# Patient Record
Sex: Female | Born: 1970 | Race: White | Hispanic: No | Marital: Married | Smoking: Former smoker
Health system: Southern US, Community
[De-identification: ages and names within clinical notes are randomized; demographics above are authoritative.]

## PROBLEM LIST (undated history)

## (undated) HISTORY — PX: OTHER SURGICAL HISTORY: SHX169

## (undated) HISTORY — PX: HYSTEROSCOPY: SHX211

## (undated) HISTORY — PX: NOVASURE ABLATION: SHX5394

## (undated) HISTORY — PX: DILATION AND CURETTAGE, DIAGNOSTIC / THERAPEUTIC: SUR384

## (undated) HISTORY — PX: KNEE SURGERY: SHX244

---

## 2001-10-26 ENCOUNTER — Other Ambulatory Visit: Admission: RE | Admit: 2001-10-26 | Discharge: 2001-10-26 | Payer: Self-pay | Admitting: Obstetrics and Gynecology

## 2002-06-07 ENCOUNTER — Inpatient Hospital Stay (HOSPITAL_COMMUNITY): Admission: AD | Admit: 2002-06-07 | Discharge: 2002-06-11 | Payer: Self-pay | Admitting: Obstetrics and Gynecology

## 2002-06-08 ENCOUNTER — Encounter (INDEPENDENT_AMBULATORY_CARE_PROVIDER_SITE_OTHER): Payer: Self-pay | Admitting: *Deleted

## 2002-06-12 ENCOUNTER — Encounter: Admission: RE | Admit: 2002-06-12 | Discharge: 2002-07-12 | Payer: Self-pay | Admitting: Obstetrics and Gynecology

## 2002-07-13 ENCOUNTER — Encounter: Admission: RE | Admit: 2002-07-13 | Discharge: 2002-08-12 | Payer: Self-pay | Admitting: Obstetrics and Gynecology

## 2002-07-13 ENCOUNTER — Other Ambulatory Visit: Admission: RE | Admit: 2002-07-13 | Discharge: 2002-07-13 | Payer: Self-pay | Admitting: Obstetrics and Gynecology

## 2002-09-12 ENCOUNTER — Encounter: Admission: RE | Admit: 2002-09-12 | Discharge: 2002-10-12 | Payer: Self-pay | Admitting: Obstetrics and Gynecology

## 2002-10-13 ENCOUNTER — Encounter: Admission: RE | Admit: 2002-10-13 | Discharge: 2002-11-12 | Payer: Self-pay | Admitting: Obstetrics and Gynecology

## 2002-12-13 ENCOUNTER — Encounter: Admission: RE | Admit: 2002-12-13 | Discharge: 2003-01-12 | Payer: Self-pay | Admitting: Obstetrics and Gynecology

## 2004-01-20 ENCOUNTER — Encounter: Payer: Self-pay | Admitting: Cardiology

## 2005-05-10 ENCOUNTER — Inpatient Hospital Stay (HOSPITAL_COMMUNITY): Admission: AD | Admit: 2005-05-10 | Discharge: 2005-05-10 | Payer: Self-pay | Admitting: Obstetrics and Gynecology

## 2005-06-03 ENCOUNTER — Encounter (INDEPENDENT_AMBULATORY_CARE_PROVIDER_SITE_OTHER): Payer: Self-pay | Admitting: Specialist

## 2005-06-03 ENCOUNTER — Inpatient Hospital Stay (HOSPITAL_COMMUNITY): Admission: AD | Admit: 2005-06-03 | Discharge: 2005-06-05 | Payer: Self-pay | Admitting: Obstetrics and Gynecology

## 2008-09-17 ENCOUNTER — Encounter: Admission: RE | Admit: 2008-09-17 | Discharge: 2008-09-17 | Payer: Self-pay | Admitting: Obstetrics and Gynecology

## 2009-05-15 ENCOUNTER — Encounter: Payer: Self-pay | Admitting: Cardiology

## 2009-05-16 ENCOUNTER — Encounter: Admission: RE | Admit: 2009-05-16 | Discharge: 2009-05-16 | Payer: Self-pay | Admitting: Family Medicine

## 2009-07-30 ENCOUNTER — Encounter: Payer: Self-pay | Admitting: Cardiology

## 2009-07-31 ENCOUNTER — Encounter: Payer: Self-pay | Admitting: Cardiology

## 2009-08-01 ENCOUNTER — Encounter: Payer: Self-pay | Admitting: Cardiology

## 2009-08-04 ENCOUNTER — Encounter: Payer: Self-pay | Admitting: Cardiology

## 2009-08-06 ENCOUNTER — Encounter: Payer: Self-pay | Admitting: Cardiology

## 2009-10-15 ENCOUNTER — Encounter: Admission: RE | Admit: 2009-10-15 | Discharge: 2009-10-15 | Payer: Self-pay | Admitting: Obstetrics and Gynecology

## 2009-11-06 ENCOUNTER — Ambulatory Visit (HOSPITAL_COMMUNITY): Admission: RE | Admit: 2009-11-06 | Discharge: 2009-11-06 | Payer: Self-pay | Admitting: Obstetrics and Gynecology

## 2010-04-16 LAB — CBC
HCT: 37.5 % (ref 36.0–46.0)
Hemoglobin: 12.5 g/dL (ref 12.0–15.0)
MCHC: 33.5 g/dL (ref 30.0–36.0)
MCV: 91.8 fL (ref 78.0–100.0)
RBC: 4.08 MIL/uL (ref 3.87–5.11)

## 2010-05-16 ENCOUNTER — Encounter: Payer: Self-pay | Admitting: Cardiology

## 2010-05-19 ENCOUNTER — Encounter: Payer: Self-pay | Admitting: Cardiology

## 2010-05-19 ENCOUNTER — Ambulatory Visit (INDEPENDENT_AMBULATORY_CARE_PROVIDER_SITE_OTHER): Payer: BC Managed Care – PPO | Admitting: Cardiology

## 2010-05-19 VITALS — BP 130/68 | HR 63 | Resp 12 | Ht 66.0 in | Wt 144.0 lb

## 2010-05-19 DIAGNOSIS — R002 Palpitations: Secondary | ICD-10-CM

## 2010-05-19 NOTE — Patient Instructions (Signed)
Your physician recommends that you schedule a follow-up appointment as needed with Dr. Daleen Squibb  Your physician has requested that you have an echocardiogram. Echocardiography is a painless test that uses sound waves to create images of your heart. It provides your doctor with information about the size and shape of your heart and how well your heart's chambers and valves are working. This procedure takes approximately one hour. There are no restrictions for this procedure.

## 2010-05-19 NOTE — Progress Notes (Signed)
   Patient ID: Tonya Cordova, female    DOB: 08-03-1970, 40 y.o.   MRN: [id number]  HPI  Tonya Cordova is referred by Dr Billy Coast for the evaluation of the feeling of fast heartbeat and palpitations with exercise. She has been extremely active in the past working out on the treadmill and lifting weights 3-4 times a week. She had a knee surgery in March 2011 and just hasn't gotten back in shape since.   She denies Chest pain, DOE, sudden onset of a tachycardia. There is no hx suggestive of hyperthyroidism. No significant bleeding.  EKG TODAY IS NORMAL.   Review of Systems  All other systems reviewed and are negative.      Physical Exam  Nursing note and vitals reviewed. Constitutional: She is oriented to person, place, and time. She appears well-developed and well-nourished. No distress.  HENT:  Head: Normocephalic and atraumatic.  Eyes: EOM are normal. Pupils are equal, round, and reactive to light.  Neck: Normal range of motion. No JVD present. No tracheal deviation present. No thyromegaly present.  Cardiovascular: Normal rate, regular rhythm, normal heart sounds and intact distal pulses.  Exam reveals no gallop.        Split S1 vs click at apex.  Pulmonary/Chest: Effort normal and breath sounds normal.  Abdominal: Soft. Bowel sounds are normal. She exhibits no distension and no mass. There is no tenderness.  Musculoskeletal: Normal range of motion. She exhibits no edema.  Neurological: She is alert and oriented to person, place, and time.  Skin: Skin is warm and dry.  Psychiatric: She has a normal mood and affect.

## 2010-05-28 ENCOUNTER — Ambulatory Visit (HOSPITAL_COMMUNITY): Payer: BC Managed Care – PPO | Attending: Cardiology | Admitting: Radiology

## 2010-05-28 DIAGNOSIS — E119 Type 2 diabetes mellitus without complications: Secondary | ICD-10-CM | POA: Insufficient documentation

## 2010-05-28 DIAGNOSIS — E785 Hyperlipidemia, unspecified: Secondary | ICD-10-CM | POA: Insufficient documentation

## 2010-05-28 DIAGNOSIS — I10 Essential (primary) hypertension: Secondary | ICD-10-CM | POA: Insufficient documentation

## 2010-05-28 DIAGNOSIS — I2510 Atherosclerotic heart disease of native coronary artery without angina pectoris: Secondary | ICD-10-CM | POA: Insufficient documentation

## 2010-05-28 DIAGNOSIS — R002 Palpitations: Secondary | ICD-10-CM

## 2010-05-30 ENCOUNTER — Telehealth: Payer: Self-pay | Admitting: Cardiology

## 2010-05-30 NOTE — Telephone Encounter (Signed)
Pt aware of ECHO results. Debbie Gloria Lambertson RN  

## 2010-05-30 NOTE — Telephone Encounter (Signed)
Pt would like results of 2 d echo .

## 2010-06-20 NOTE — Op Note (Signed)
NAME:  Tonya Cordova, Tonya Cordova                           ACCOUNT NO.:  [id number]   MEDICAL RECORD NO.:  [id number]                   PATIENT TYPE:  INP   LOCATION:  9110                                 FACILITY:  [facility]   PHYSICIAN:  Lenoard Aden, M.D.             DATE OF BIRTH:  [birth year]   DATE OF PROCEDURE:  06/08/2002  DATE OF DISCHARGE:                                 OPERATIVE REPORT   PREOPERATIVE DIAGNOSES:  1. A 42 week intrauterine pregnancy.  2. Single umbilical artery.  3. Nonreassuring fetal heart rate.   POSTOPERATIVE DIAGNOSES:  1. A 42 week intrauterine pregnancy.  2. Single umbilical artery.  3. Nonreassuring fetal heart rate.   PROCEDURE:  Primary left TCS.   SURGEON:  Lenoard Aden, M.D.   ANESTHESIA:  Epidural by Quillian Quince, M.D.   ESTIMATED BLOOD LOSS:  1500 mL.   COUNTS:  Correct.   DRAINS:  Foley.   COMPLICATIONS:  None.   FINDINGS:  Full-term living female, 8 pounds 13 ounces, Apgars 8 and 9.  Placenta delivered manually, intact.  Two-vessel cord noted.  Pediatrics in  attendance.  Bulb suctioning performed.  Placenta to pathology.   BRIEF OPERATIVE NOTE:  After being apprised of the risks of anesthesia,  infection, bleeding, injury to abdominal organs, need for repair, patient is  brought to the operating room where administered an epidural anesthetic  without complications, prepped and draped in the usual sterile fashion, a  Foley catheter placed.  After achieving adequate anesthesia, dilute Marcaine  solution placed in the area and Pfannenstiel skin incision then made with  the scalpel, nicked in the midline, carried down to the fascia, and opened  transversely using Mayo scissors.  Rectus muscle is dissected sharply in the  midline, peritoneum entered sharply, the bladder blade placed.  Visceroperitoneum is scored in a smile-like fashion, Kerr hysterotomy  incision, atraumatically delivery, full-term, living female, Apgars 8 and 9,  pediatrics in attendance, placenta delivered manually intact from posterior  position.  Three-vessel cord noted.  Uterus is exteriorized, and normal  tubes and ovaries are noted.  Uterus is closed after being curetted with dry  lap pack and is closed in two layers using a running Monocryl suture, second  embrocating layer is placed.  Good hemostasis is achieved.  Bladder flap is  inspected.  Good hemostasis is accomplished.  Uterus is replaced in the  abdominal cavity.  Paracolic gutters irrigated, all blood clots subsequently  removed.  Peritoneum is inspected.  Bladder flap is inspected.  Uterine  incision is reinspected.  All found to have good hemostasis.  Please  note that postpartum uterine atony was controlled using a dilute Pitocin  solution and IM Methergine.  At this time, fascia is then closed using a 0  Monocryl in continuous running fashion.  Skin closed using staples.  The  patient tolerates the procedure well and goes to  recovery in good condition.                                               Lenoard Aden, M.D.    RJT/MEDQ  D:  06/08/2002  T:  06/10/2002  Job:  [id number]

## 2010-06-20 NOTE — Op Note (Signed)
[name], Tonya Cordova                 ACCOUNT NO.:  [id number]   MEDICAL RECORD NO.:  [id number]          PATIENT TYPE:  INP   LOCATION:  9124                          [facility]ILITY:  [facility]   PHYSICIAN:  Lenoard Aden, M.D.DATE OF BIRTH:  1970-11-08   DATE OF PROCEDURE:  06/03/2005  DATE OF DISCHARGE:                                 OPERATIVE REPORT   PREOPERATIVE DIAGNOSES:  1.  A 39-week intrauterine pregnancy.  2.  Previous section for repeat.  3.  Tubal ligation.   POSTOPERATIVE DIAGNOSES:  1.  A 39-week intrauterine pregnancy.  2.  Previous section for repeat.  3.  Tubal ligation.   PROCEDURES:  1.  Repeat low segment transverse cesarean section.  2.  Tubal ligation.   SURGEON:  Lenoard Aden, M.D.   ASSISTANT:  Marlinda Mike, C.N.M.   ANESTHESIA:  Spinal by Octaviano Glow. Pamalee Leyden, M.D.   ESTIMATED BLOOD LOSS:  1000 mL.   COMPLICATIONS:  None.   DRAINS:  Foley catheter.   INSTRUMENT COUNT:  Correct.   Patient to recovery room in good condition.   FINDINGS:  A full-term living female, Apgars 9 and 26.  Pediatricians in  attendance.  Tubal segments to pathology. Normal tubes and normal ovaries  noted.   DESCRIPTION OF PROCEDURE:  After being apprised of the risks of anesthesia,  infection, bleeding, injury to abdominal organs and need for repair, delayed  versus immediate complications to include bowel and bladder injury, patient  brought to the operating room where she was administered spinal anesthetic  without complications.  Prepped and draped in usual sterile fashion.  Foley  catheter placed.  After achieving adequate anesthesia, dilute Marcaine  solution placed in the area of previous Pfannenstiel skin incision made with  the scalpel, carried down to the fascia which was nicked in the midline and  opened transversely using Mayo scissors.  Rectus muscles dissected sharply  in the midline.  Peritoneum entered sharply.  Bladder blade placed.  Visceral peritoneum  scored in a smile-like fashion and entered sharply and  dissected sharply off the lower uterine segment.  Curved hysterotomy made  and extended using bandage scissors.  Atraumatic delivery of a full term  living female handed to the pediatricians in attendance.  Apgars of 8 and 9.  Cord blood collected.  Placenta delivered manually intact.  Three vessel  cord noted.  Uterus exteriorized and closed in one layer after being  curetted using a dry lap pad with 0 Monocryl suture.  Normal tubes, normal  ovaries noted.  At this time, the left tube was traced out to the fimbriated  end and ampullary isthmic portion of the tube is identified and avascular  portion of the mesosalpinx was cauterized, creating a window proximal and  distal plane ties are placed.  Tubal segment is excised.  The same procedure  which was done on the left tube is repeated on the right tube.  Tubal  segment is excised and both tubal lumens are visualized and cauterized on  both sides.  Incision is reinspected and found to be hemostatic.  Irrigation  is accomplished.  Bladder flap inspected and found to be hemostatic.  Fascia  then closed using 0 Monocryl in continuous running fashion.  Skin closed  using staples.  The patient tolerated the procedure well and was transferred  to recovery room in good condition.      Lenoard Aden, M.D.  Electronically Signed     RJT/MEDQ  D:  06/03/2005  T:  06/04/2005  Job:  [id number]

## 2010-06-20 NOTE — Discharge Summary (Signed)
   NAME:  Tonya Cordova, Tonya Cordova                           ACCOUNT NO.:  [id number]   MEDICAL RECORD NO.:  [id number]                   PATIENT TYPE:  INP   LOCATION:  9110                                 [facility]ILITY[facility]   PHYSICIAN:  Lenoard Aden, M.D.             DATE OF BIRTH:  [birth year]   DATE OF ADMISSION:  06/07/2002  DATE OF DISCHARGE:  06/11/2002                                 DISCHARGE SUMMARY   SUMMARY:  The patient underwent an uncomplicated primary C section for  nonreassuring fetal heart rate on Jun 08, 2002.  Postoperative course was  uncomplicated.  Tolerated a regular diet well.  Discharged to home on  postoperative day #3.  Prenatal vitamins, iron tablets given.  Follow up in  the office in four to six weeks.                                               Lenoard Aden, M.D.    RJT/MEDQ  D:  07/19/2002  T:  07/19/2002  Job:  [id number]

## 2010-06-20 NOTE — H&P (Signed)
   NAME:  Tonya Cordova, Tonya Cordova                           ACCOUNT NO.:  [id number]   MEDICAL RECORD NO.:  [id number]                   PATIENT TYPE:  INP   LOCATION:  9163                                 [facility]   PHYSICIAN:  Lenoard Aden, M.D.             DATE OF BIRTH:  [birth year]   DATE OF ADMISSION:  06/07/2002  DATE OF DISCHARGE:                                HISTORY & PHYSICAL   CHIEF COMPLAINT:  Post dates.   HISTORY OF PRESENT ILLNESS:  The patient is a 40 year old, white female, G1,  P0 with [facility] May 27, 2002, at 41-5/7 weeks who presents for cervical  ripening and induction.   ALLERGIES:  No known drug allergies.   MEDICATIONS:  Prenatal vitamins.   PAST OBSTETRICAL HISTORY:  Noncontributory.   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  No other medical or surgical hospitalizations.   FAMILY HISTORY:  Congenital heart disease.  Thyroid dysfunction.  Emphysema.  Heart disease.  Hypertension.   PRENATAL LABORATORY DATA:  Blood type O positive, Rh antibody negative,  rubella immune.  Hepatitis B surface antigen negative.  HIV nonreactive.  Pregnancy complicated by umbilical artery.   PHYSICAL EXAMINATION:  GENERAL:  She is a well-developed, well-nourished,  white female in no apparent distress.  HEENT:  Normal.  LUNGS:  Clear.  HEART:  Regular rate and rhythm.  ABDOMEN:  Soft, gravid, nontender.  Estimated fetal weight 9-1/2 pounds.  PELVIC:  Cervix is flared, 3 cm, vertex and -2.  EXTREMITIES:  No cords.  NEUROLOGIC:  Nonfocal.   IMPRESSION:  Post dates intrauterine pregnancy for cervical ripening and  induction.   PLAN:  Cervidil and Pitocin.  Anticipate delivery.                                                Lenoard Aden, M.D.    RJT/MEDQ  D:  06/07/2002  T:  06/08/2002  Job:  [id number]

## 2010-06-20 NOTE — Discharge Summary (Signed)
Tonya Cordova, Tonya Cordova                 ACCOUNT NO.:  [id number]   MEDICAL RECORD NO.:  [id number]          PATIENT TYPE:  INP   LOCATION:  9124                          [facility]   PHYSICIAN:  Lenoard Aden, M.D.DATE OF BIRTH:  [birth year]   DATE OF ADMISSION:  06/03/2005  DATE OF DISCHARGE:  06/05/2005                                 DISCHARGE SUMMARY   Patient with an uncomplicated primary repeat cesarean section Jun 03, 2005.  Postoperative course uncomplicated.  Tolerated, gave regular diet.  Postoperative hemoglobin 10.1.  Discharged to home on day #2.  Prenatal  vitamins, Percocet and iron given.  Follow-up in the office in four to six  weeks. Discharge teaching done.      Lenoard Aden, M.D.  Electronically Signed     RJT/MEDQ  D:  07/05/2005  T:  07/06/2005  Job:  [id number]

## 2013-05-08 ENCOUNTER — Ambulatory Visit: Payer: BC Managed Care – PPO | Admitting: Family Medicine

## 2013-05-08 VITALS — BP 110/70 | HR 81 | Temp 98.8°F | Resp 16 | Ht 66.0 in | Wt 147.0 lb

## 2013-05-08 DIAGNOSIS — R350 Frequency of micturition: Secondary | ICD-10-CM

## 2013-05-08 DIAGNOSIS — N10 Acute tubulo-interstitial nephritis: Secondary | ICD-10-CM

## 2013-05-08 DIAGNOSIS — R109 Unspecified abdominal pain: Secondary | ICD-10-CM

## 2013-05-08 LAB — POCT URINALYSIS DIPSTICK
Bilirubin, UA: NEGATIVE
Glucose, UA: NEGATIVE
Nitrite, UA: NEGATIVE
Protein, UA: 100
Spec Grav, UA: 1.025
Urobilinogen, UA: 0.2
pH, UA: 5

## 2013-05-08 LAB — POCT UA - MICROSCOPIC ONLY
Casts, Ur, LPF, POC: NEGATIVE
Crystals, Ur, HPF, POC: NEGATIVE
Yeast, UA: NEGATIVE

## 2013-05-08 LAB — POCT CBC
GRANULOCYTE PERCENT: 79.7 % (ref 37–80)
HCT, POC: 42.5 % (ref 37.7–47.9)
Hemoglobin: 13.7 g/dL (ref 12.2–16.2)
Lymph, poc: 1.6 (ref 0.6–3.4)
MCH: 32 pg — AB (ref 27–31.2)
MCHC: 32.2 g/dL (ref 31.8–35.4)
MCV: 99.4 fL — AB (ref 80–97)
MID (CBC): 0.6 (ref 0–0.9)
MPV: 8.9 fL (ref 0–99.8)
PLATELET COUNT, POC: 238 10*3/uL (ref 142–424)
POC GRANULOCYTE: 8.4 — AB (ref 2–6.9)
POC LYMPH %: 14.8 % (ref 10–50)
POC MID %: 5.5 % (ref 0–12)
RBC: 4.28 M/uL (ref 4.04–5.48)
RDW, POC: 13.8 %
WBC: 10.6 10*3/uL — AB (ref 4.6–10.2)

## 2013-05-08 LAB — POCT URINE PREGNANCY: PREG TEST UR: NEGATIVE

## 2013-05-08 MED ORDER — CEFTRIAXONE SODIUM 1 G IJ SOLR
1.0000 g | Freq: Once | INTRAMUSCULAR | Status: AC
  Administered 2013-05-08: 1 g via INTRAMUSCULAR

## 2013-05-08 MED ORDER — CIPROFLOXACIN HCL 500 MG PO TABS: 500.0000 mg | ORAL_TABLET | Freq: Two times a day (BID) | ORAL | Status: DC

## 2013-05-08 NOTE — Progress Notes (Addendum)
[facility] [address], [place] [id number] [phone]  Date:  05/08/2013   Name:  Tonya Cordova   DOB:  08-05-1970   MRN:  [id number]  PCP:  Lenoard AdenAAVON,RICHARD J, MD    Chief Complaint: Abdominal Cramping and Urinary Frequency   History of Present Illness:  Tonya Cordova is a 43 y.o. very pleasant female patient who presents with the following:  Here today with possible UTI.  She has noted urinary frequncy and urgency, but no dysuria for the last 3 or 4 days. No hematuria. She also notes lower abdominal discomfort, poor appetite, nausea but no vomiting. She has had some loose stools but not diarrhea.  Her back does hurt.  She has not noted a fever subjective at home She is generally healthy  She does not get menses since she had an ablation.   No other history of abdominal surgery except c/s.   No vaginal discharge, no new sexual partners    [place]  Patient Active Problem List   Diagnosis Date Noted  . Palpitations 05/19/2010    Past Medical History  Diagnosis Date  . Asthma     exercise induced    Past Surgical History  Procedure Laterality Date  . Dilation and curettage, diagnostic / therapeutic    . Cesarean section    . Knee surgery    . Tubul ligation    . Hysteroscopy    . Novasure ablation      History  Substance Use Topics  . Smoking status: Former Games developermoker  . Smokeless tobacco: Not on file  . Alcohol Use: Yes     Comment: moderate    Family History  Problem Relation Age of Onset  . Congenital heart disease Other   . Emphysema Other   . Thyroid disease Other   . Hypertension Other   . Heart failure Other   . Heart attack Paternal Grandfather   . Thyroid disease Mother     No Known Allergies  Medication list has been reviewed and updated.  No current outpatient prescriptions on file prior to visit.   No current facility-administered medications on file prior to  visit.    Review of Systems:  As per HPI- otherwise negative.   Physical Examination: Filed Vitals:   05/08/13 1504  BP: 110/70  Pulse: 81  Temp: 98.8 F (37.1 C)  Resp: 16   Filed Vitals:   05/08/13 1504  Height: 5\' 6"  (1.676 m)  Weight: 147 lb (66.679 kg)   Body mass index is 23.74 kg/(m^2). Ideal Body Weight: Weight in (lb) to have BMI = 25: 154.6  GEN: WDWN, NAD, Non-toxic, A & O x 3, looks well HEENT: Atraumatic, Normocephalic. Neck supple. No masses, No LAD. Ears and Nose: No external deformity. CV: RRR, No M/G/R. No JVD. No thrill. No extra heart sounds. PULM: CTA B, no wheezes, crackles, rhonchi. No retractions. No resp. distress. No accessory muscle use. ABD: S, ND, +BS. No rebound. No HSM. She is tender across the entire lower abdomen, most suprapubic EXTR: No c/c/e NEURO Normal gait.  PSYCH: Normally interactive. Conversant. Not depressed or anxious appearing.  Calm demeanor.  Gu: normal exam, no CMT, no discharge, no vaginal lesions . No adnexal masses, uterus non- tender  Results for orders placed in visit on 05/08/13  POCT URINALYSIS DIPSTICK      Result Value Ref Range   Color,  UA yellow     Clarity, UA cloudy     Glucose, UA neg     Bilirubin, UA neg     Ketones, UA trace     Spec Grav, UA 1.025     Blood, UA large     pH, UA 5.0     Protein, UA 100     Urobilinogen, UA 0.2     Nitrite, UA neg     Leukocytes, UA large (3+)    POCT UA - MICROSCOPIC ONLY      Result Value Ref Range   WBC, Ur, HPF, POC TNTC     RBC, urine, microscopic 2-4     Bacteria, U Microscopic trace     Mucus, UA nef     Epithelial cells, urine per micros 0-1     Crystals, Ur, HPF, POC neg     Casts, Ur, LPF, POC neg     Yeast, UA neg    POCT URINE PREGNANCY      Result Value Ref Range   Preg Test, Ur Negative    POCT CBC      Result Value Ref Range   WBC 10.6 (*) 4.6 - 10.2 K/uL   Lymph, poc 1.6  0.6 - 3.4   POC LYMPH PERCENT 14.8  10 - 50 %L   MID (cbc) 0.6  0  - 0.9   POC MID % 5.5  0 - 12 %M   POC Granulocyte 8.4 (*) 2 - 6.9   Granulocyte percent 79.7  37 - 80 %G   RBC 4.28  4.04 - 5.48 M/uL   Hemoglobin 13.7  12.2 - 16.2 g/dL   HCT, POC 16.1  09.6 - 47.9 %   MCV 99.4 (*) 80 - 97 fL   MCH, POC 32.0 (*) 27 - 31.2 pg   MCHC 32.2  31.8 - 35.4 g/dL   RDW, POC 04.5     Platelet Count, POC 238  142 - 424 K/uL   MPV 8.9  0 - 99.8 fL    Assessment and Plan: Frequent urination - Plan: POCT urinalysis dipstick, POCT UA - Microscopic Only, POCT urine pregnancy, Urine culture, ciprofloxacin (CIPRO) 500 MG tablet, cefTRIAXone (ROCEPHIN) injection 1 g  Abdominal pain, other specified site - Plan: POCT CBC, Comprehensive metabolic panel  Acute pyelonephritis  Treat for pyelonephritis with rocephin IM and cipro.  Discussed the less likely possibility of another etiology of her pain, such as appendicitis.  Offered to perform a CT scan today.  However at this time she feels comfortable with dx of UTI/ mild pyelo and declines CT.  However she will follow-up closely if she is not feeling better in the next 24 hours- Sooner if worse.   Follow-up pending CMP and urine culture  Signed Abbe Amsterdam, MD  05/10/13: called and spoke with her.  She feels "great."  Explained that suprisingly her urine culture was negative.  As long as she is feeling better, will plan to recheck her UA/micro when she gets back into town.  She agreed.

## 2013-05-08 NOTE — Patient Instructions (Signed)
We are going to treat you for a UTI/ pyelonephritis (kidney infection- mild). Use the cipro as directed and continue to drink plenty of water.  Purchase some pyridium (azo is one brand name) OTC and use as needed for urinary symptoms.    If you are not feeling better tomorrow or if you continue to have pain please come back and see us or go to the ER.   If you continue to have pain we should do a CT scan.    I will culture your urine to find out what kind of bacteria you have and to make sure you are on the right antibiotic- will be in touch with you when your culture comes in.

## 2013-05-09 LAB — COMPREHENSIVE METABOLIC PANEL
ALBUMIN: 4.2 g/dL (ref 3.5–5.2)
ALK PHOS: 70 U/L (ref 39–117)
ALT: 15 U/L (ref 0–35)
AST: 15 U/L (ref 0–37)
BUN: 12 mg/dL (ref 6–23)
CO2: 27 mEq/L (ref 19–32)
Calcium: 9.6 mg/dL (ref 8.4–10.5)
Chloride: 101 mEq/L (ref 96–112)
Creat: 0.68 mg/dL (ref 0.50–1.10)
Glucose, Bld: 100 mg/dL — ABNORMAL HIGH (ref 70–99)
Potassium: 4.4 mEq/L (ref 3.5–5.3)
Sodium: 139 mEq/L (ref 135–145)
TOTAL PROTEIN: 6.7 g/dL (ref 6.0–8.3)
Total Bilirubin: 0.6 mg/dL (ref 0.2–1.2)

## 2013-05-09 LAB — URINE CULTURE: Colony Count: 8000

## 2013-05-10 ENCOUNTER — Encounter: Payer: Self-pay | Admitting: Family Medicine

## 2013-05-10 NOTE — Addendum Note (Signed)
Addended by: Abbe AmsterdamOPLAND, Kesia Dalto C on: 05/10/2013 12:10 PM   Modules accepted: Orders

## 2014-07-28 ENCOUNTER — Ambulatory Visit (INDEPENDENT_AMBULATORY_CARE_PROVIDER_SITE_OTHER): Payer: BLUE CROSS/BLUE SHIELD | Admitting: Emergency Medicine

## 2014-07-28 ENCOUNTER — Ambulatory Visit (INDEPENDENT_AMBULATORY_CARE_PROVIDER_SITE_OTHER): Payer: BLUE CROSS/BLUE SHIELD

## 2014-07-28 VITALS — BP 116/72 | HR 87 | Temp 98.6°F | Resp 18 | Ht 66.0 in | Wt 148.8 lb

## 2014-07-28 DIAGNOSIS — M25571 Pain in right ankle and joints of right foot: Secondary | ICD-10-CM

## 2014-07-28 DIAGNOSIS — S92301A Fracture of unspecified metatarsal bone(s), right foot, initial encounter for closed fracture: Secondary | ICD-10-CM | POA: Diagnosis not present

## 2014-07-28 NOTE — Progress Notes (Addendum)
   Subjective:    Patient ID: Tonya Cordova, female    DOB: 12-21-1970, 44 y.o.   MRN: [id number] This chart was scribed for Lesle Chris, MD by Jolene Provost, Medical Scribe. This patient was seen in Room 1 and the patient's care was started at 11:08 AM.  Chief Complaint  Patient presents with  . Foot Injury    right foot    HPI HPI Comments: Tonya Cordova is a 44 y.o. female who presents to [facility] complaining of right foot pain after zip lining yesterday. Pt states when she landed at the end of the zip line she rolled her foot. Pt endorses associated bruising and weakness in right foot secondary to pain. Pt denies pain in ankle and states the feeling in her foot is intact. Pt denies any other injury.    Review of Systems  Constitutional: Negative for fever and chills.  Skin: Positive for color change. Negative for wound.  Neurological: Positive for weakness (Secondary to pain).       Objective:   Physical Exam  Constitutional: She is oriented to person, place, and time. She appears well-developed and well-nourished. No distress.  HENT:  Head: Normocephalic and atraumatic.  Eyes: Pupils are equal, round, and reactive to light.  Neck: Neck supple.  Cardiovascular: Normal rate.   Pulmonary/Chest: Effort normal. No respiratory distress.  Musculoskeletal: Normal range of motion.  Neurological: She is alert and oriented to person, place, and time. Coordination normal.  Skin: Skin is warm and dry. She is not diaphoretic.  4/4 cm area of bruising over the right midfoot.  Psychiatric: She has a normal mood and affect. Her behavior is normal.  Nursing note and vitals reviewed. [facility] reading (PRIMARY) by  Dr. Cleta Alberts there is a nondisplaced fracture at the base of the fifth metatarsal.      Assessment & Plan:   Patient has an essentially nondisplaced fracture at the base of the fifth metatarsal. She is placed in a short cam boot follow-up x-rays in approximately 10-14 days.I personally  performed the services described in this documentation, which was scribed in my presence. The recorded information has been reviewed and is accurate.  Earl Lites, MD

## 2014-07-28 NOTE — Patient Instructions (Signed)
Metatarsal Fracture, Undisplaced  A metatarsal fracture is a break in the bone(s) of the foot. These are the bones of the foot that connect your toes to the bones of the ankle.  DIAGNOSIS   The diagnoses of these fractures are usually made with X-rays. If there are problems in the forefoot and x-rays are normal a later bone scan will usually make the diagnosis.   TREATMENT AND HOME CARE INSTRUCTIONS  · Treatment may or may not include a cast or walking shoe. When casts are needed the use is usually for short periods of time so as not to slow down healing with muscle wasting (atrophy).  · Activities should be stopped until further advised by your caregiver.  · Wear shoes with adequate shock absorbing capabilities and stiff soles.  · Alternative exercise may be undertaken while waiting for healing. These may include bicycling and swimming, or as your caregiver suggests.  · It is important to keep all follow-up visits or specialty referrals. The failure to keep these appointments could result in improper bone healing and chronic pain or disability.  · Warning: Do not drive a car or operate a motor vehicle until your caregiver specifically tells you it is safe to do so.  IF YOU DO NOT HAVE A CAST OR SPLINT:  · You may walk on your injured foot as tolerated or advised.  · Do not put any weight on your injured foot for as long as directed by your caregiver. Slowly increase the amount of time you walk on the foot as the pain allows or as advised.  · Use crutches until you can bear weight without pain. A gradual increase in weight bearing may help.  · Apply ice to the injury for 15-20 minutes each hour while awake for the first 2 days. Put the ice in a plastic bag and place a towel between the bag of ice and your skin.  · Only take over-the-counter or prescription medicines for pain, discomfort, or fever as directed by your caregiver.  SEEK IMMEDIATE MEDICAL CARE IF:   · Your cast gets damaged or breaks.  · You have  continued severe pain or more swelling than you did before the cast was put on, or the pain is not controlled with medications.  · Your skin or nails below the injury turn blue or grey, or feel cold or numb.  · There is a bad smell, or new stains or pus-like (purulent) drainage coming from the cast.  MAKE SURE YOU:   · Understand these instructions.  · Will watch your condition.  · Will get help right away if you are not doing well or get worse.  Document Released: 10/11/2001 Document Revised: 04/13/2011 Document Reviewed: 09/02/2007  ExitCare® Patient Information ©2015 [employer]. This information is not intended to replace advice given to you by your health care provider. Make sure you discuss any questions you have with your health care provider.

## 2014-08-16 ENCOUNTER — Encounter: Payer: Self-pay | Admitting: Family Medicine

## 2014-08-16 ENCOUNTER — Ambulatory Visit (INDEPENDENT_AMBULATORY_CARE_PROVIDER_SITE_OTHER): Payer: BLUE CROSS/BLUE SHIELD | Admitting: Family Medicine

## 2014-08-16 ENCOUNTER — Ambulatory Visit (INDEPENDENT_AMBULATORY_CARE_PROVIDER_SITE_OTHER): Payer: BLUE CROSS/BLUE SHIELD

## 2014-08-16 VITALS — BP 108/70 | HR 67 | Temp 98.2°F | Resp 15 | Ht 66.0 in | Wt 151.0 lb

## 2014-08-16 DIAGNOSIS — S92301D Fracture of unspecified metatarsal bone(s), right foot, subsequent encounter for fracture with routine healing: Secondary | ICD-10-CM | POA: Diagnosis not present

## 2014-08-16 NOTE — Patient Instructions (Signed)
Follow-up on 09/02/2014 8:30 AM to 3 PM with Dr. Norberto SorensonEva Shaw  Wear the Verdene Rioam Walker and continue being cautious  Return if needed sooner  We will let you know if the radiologist has any additional concerns.

## 2014-08-16 NOTE — Progress Notes (Signed)
  Subjective:  Patient ID: Tonya Cordova, female    DOB: 01-Apr-1970  Age: 44 y.o. MRN: [id number]  Patient is here for a follow-up from her fracture of the right fifth metatarsal about 3 weeks ago. She was scheduled to see Tonya Cordova, but she came in at an hour that he was not here. However he was passing through the building so I had an opportunity to discuss it with him. No major acute distress. She has continued to have some pain, says she probably bears weight too much of the time. 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 It only hurts with weightbearing.  She is tolerating the [employer] Humph[employer]Walker, but is planning a trip to [place] in early August and wanted to know how long she will need to be wearing this. We discussed that she would probably need it on for another 3-5 weeks at least. We'll try and get her checked before she goes.   Objective:   Tenderness proximal fifth metatarsal, mild to moderate. No swelling.  [facility] reading (PRIMARY) by  Dr. Alwyn RenHopper Fracture visible on one view, still in place. Not much callus formation yet. Assessment & Plan:   Assessment: Proximal fifth metatarsal fracture, healing  Plan: Both Tonya Cordova and I will be absent before your trip to [place], so asked Tonya Cordova to follow-up on this. Patient Instructions  Follow-up on 09/02/2014 8:30 AM to 3 PM with Tonya Cordova  Wear the Tonya Cordova and continue being cautious  Return if needed sooner  We will let you know if the radiologist has any additional concerns.     Tonya Allston, MD 08/16/2014

## 2015-10-19 IMAGING — CR DG FOOT COMPLETE 3+V*R*
3 series · 3 of 3 positions shown · non-contrast
Comparison: None.

CLINICAL DATA: Right foot pain after falling off of a zipline last
night

EXAM:
RIGHT FOOT COMPLETE - 3+ VIEW

[AP]
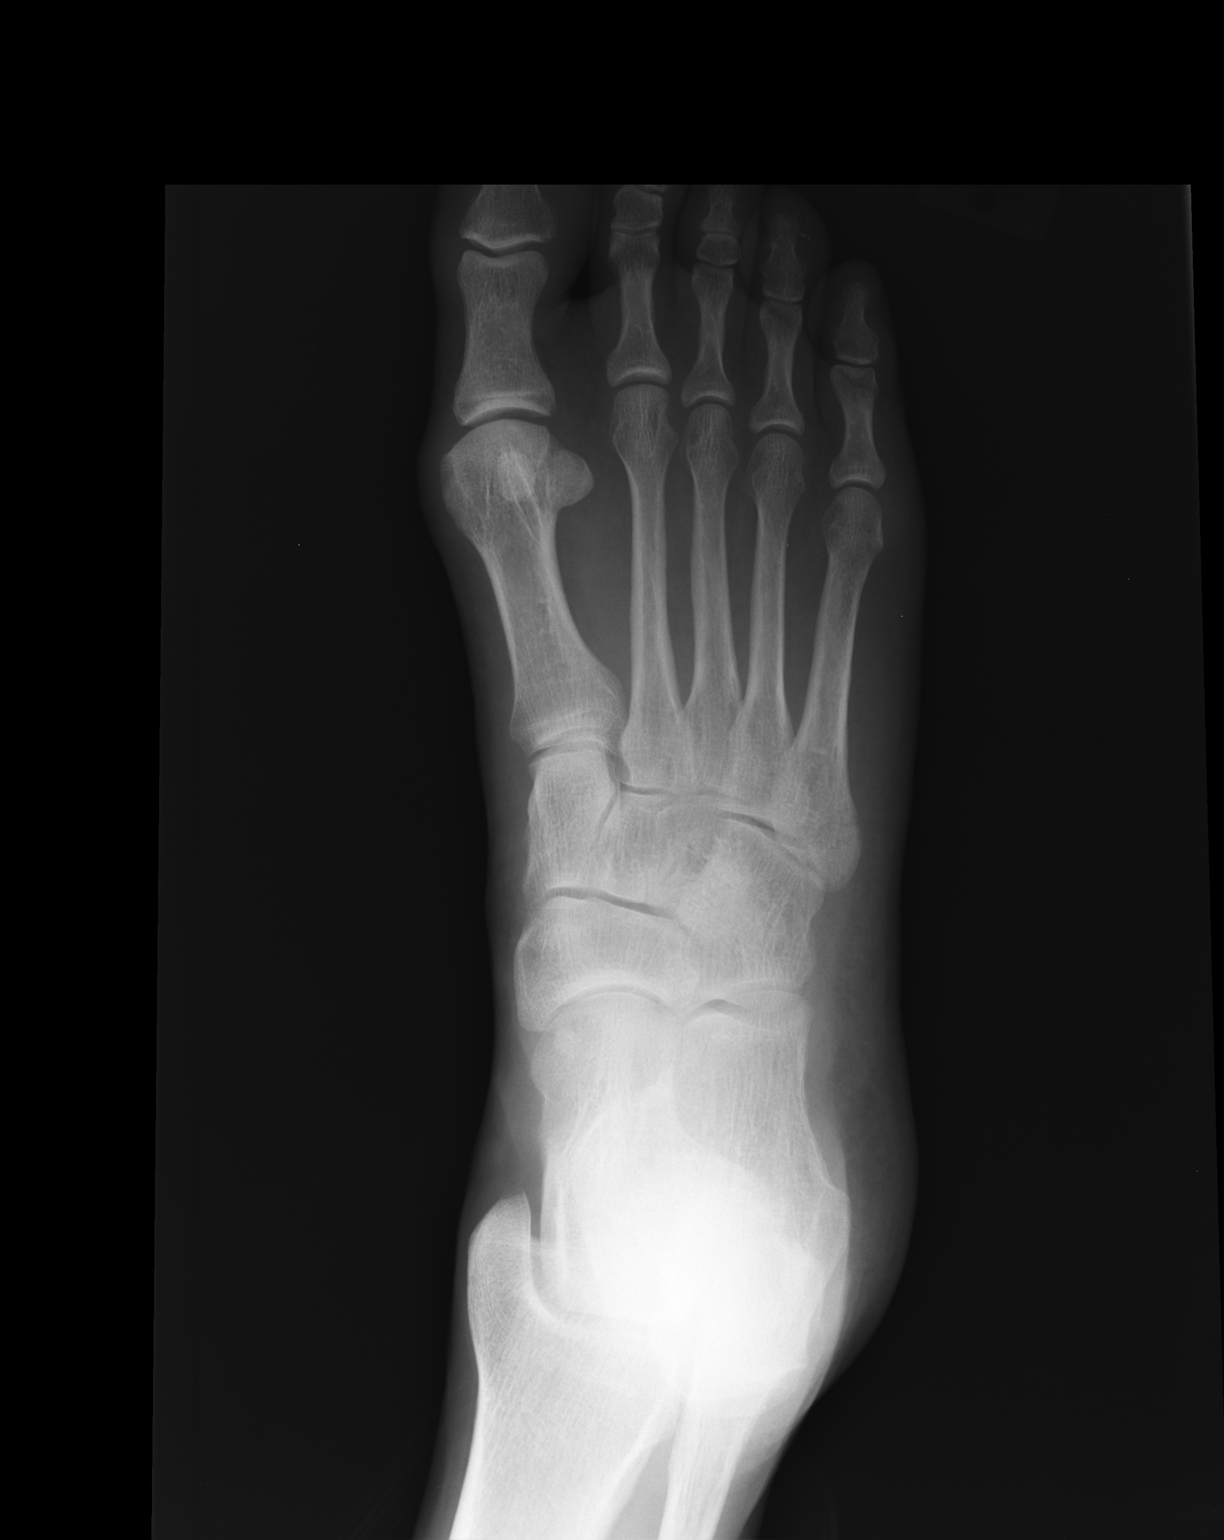

[ap obl int rot]
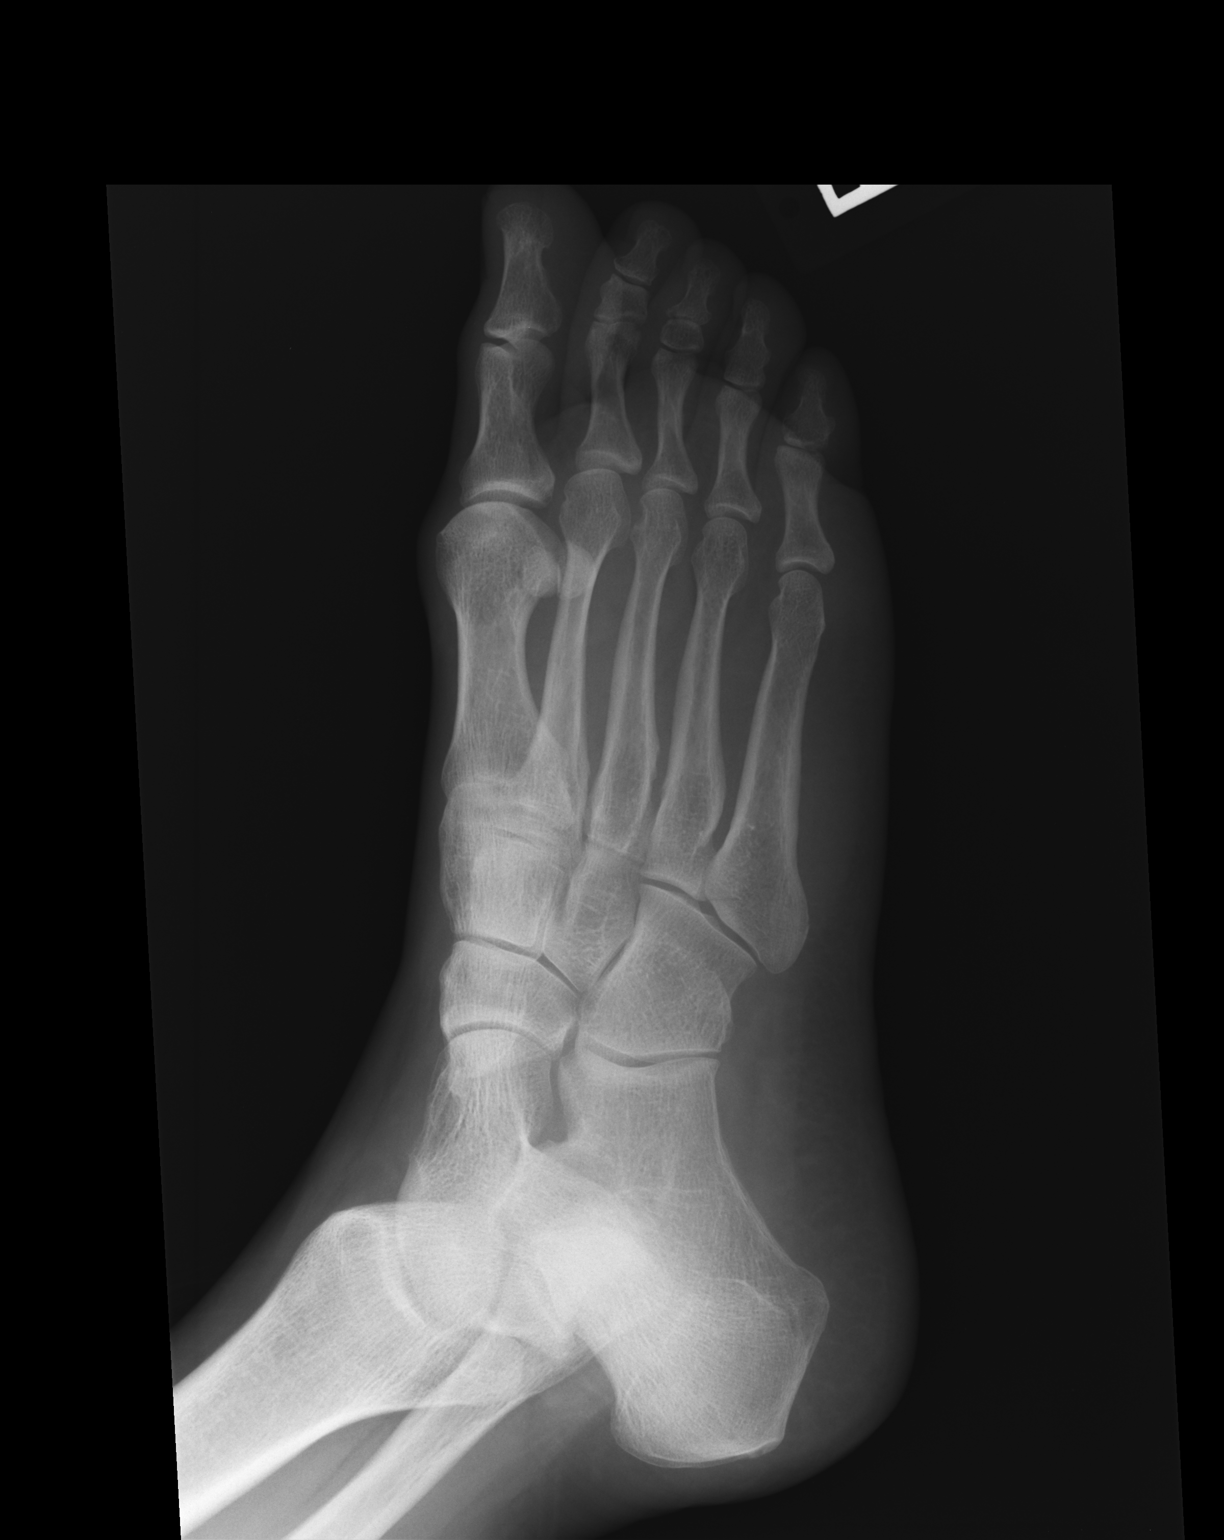

[lateral]
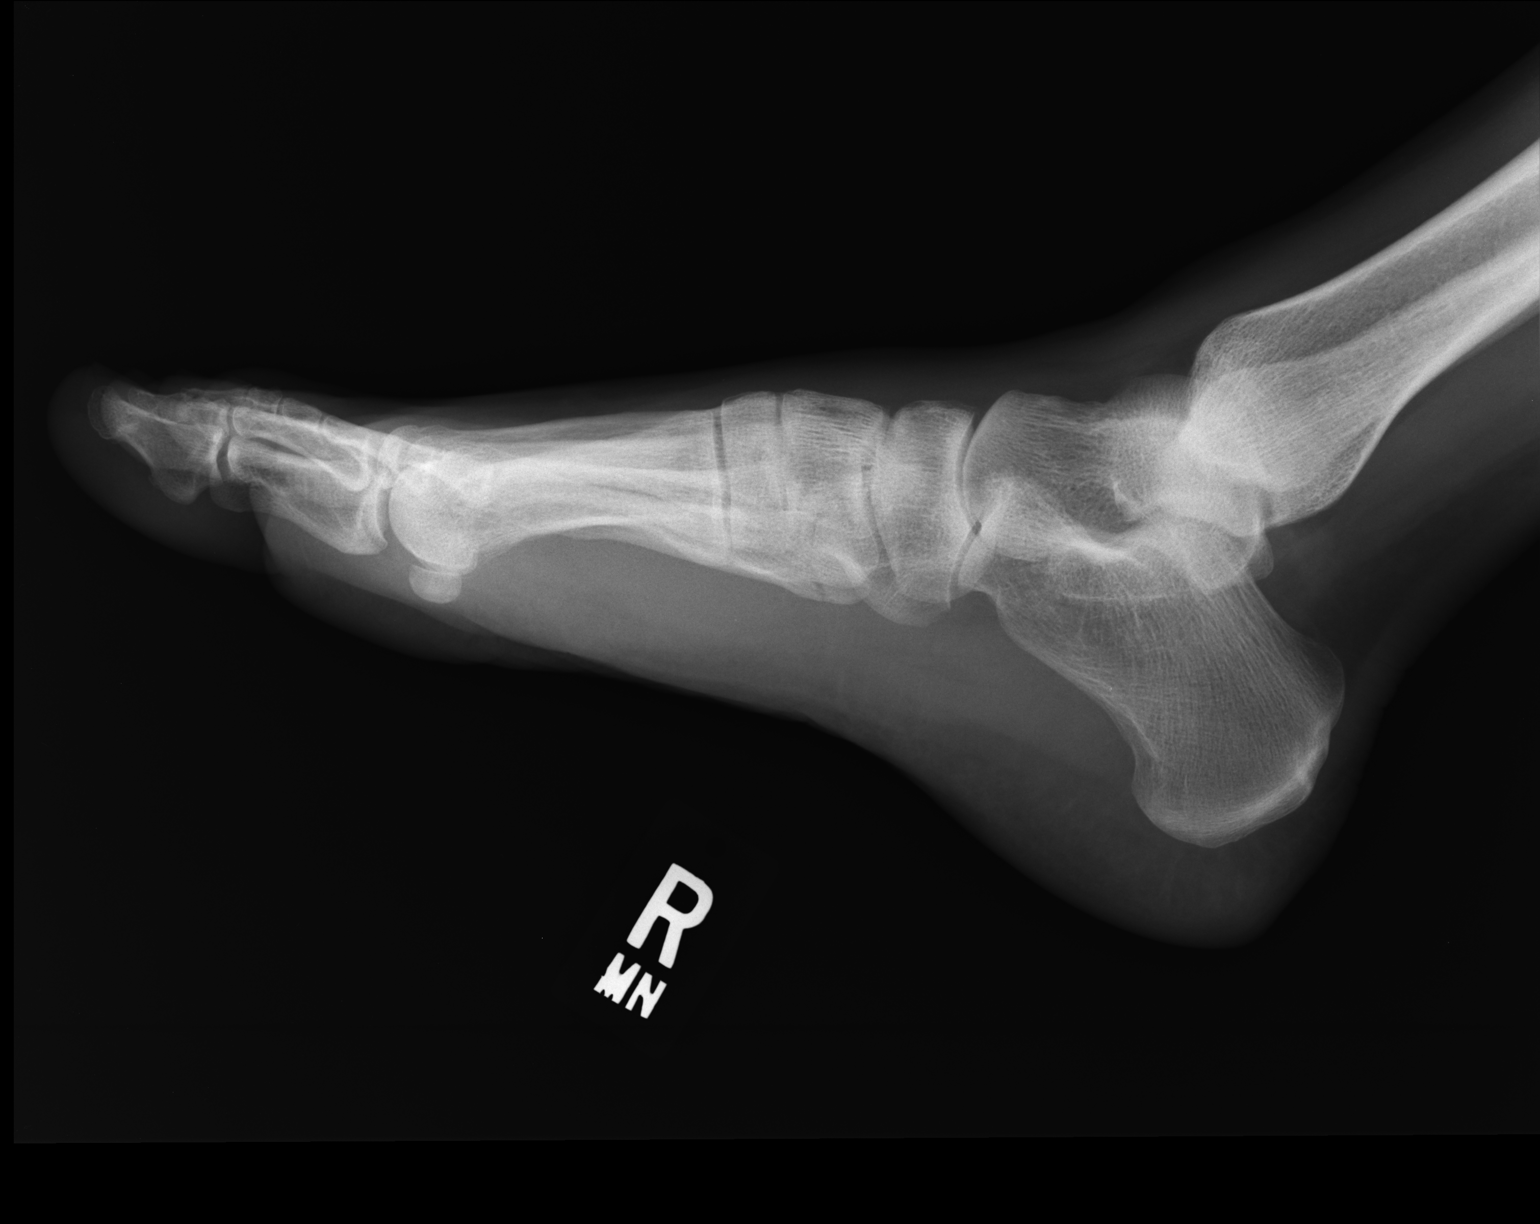

[3 of 3 positions shown; findings below may reference images not displayed]

FINDINGS: There is a nondisplaced hairline fracture at the base of the fifth
metatarsal. There are no other focal abnormalities.
IMPRESSION: Fifth metatarsal fracture

## 2016-07-09 IMAGING — CR DG FOOT COMPLETE 3+V*R*
3 series · 3 of 3 positions shown · non-contrast
Comparison: None.

CLINICAL DATA: Right fifth metatarsal fracture.

EXAM:
RIGHT FOOT COMPLETE - 3+ VIEW

[AP]
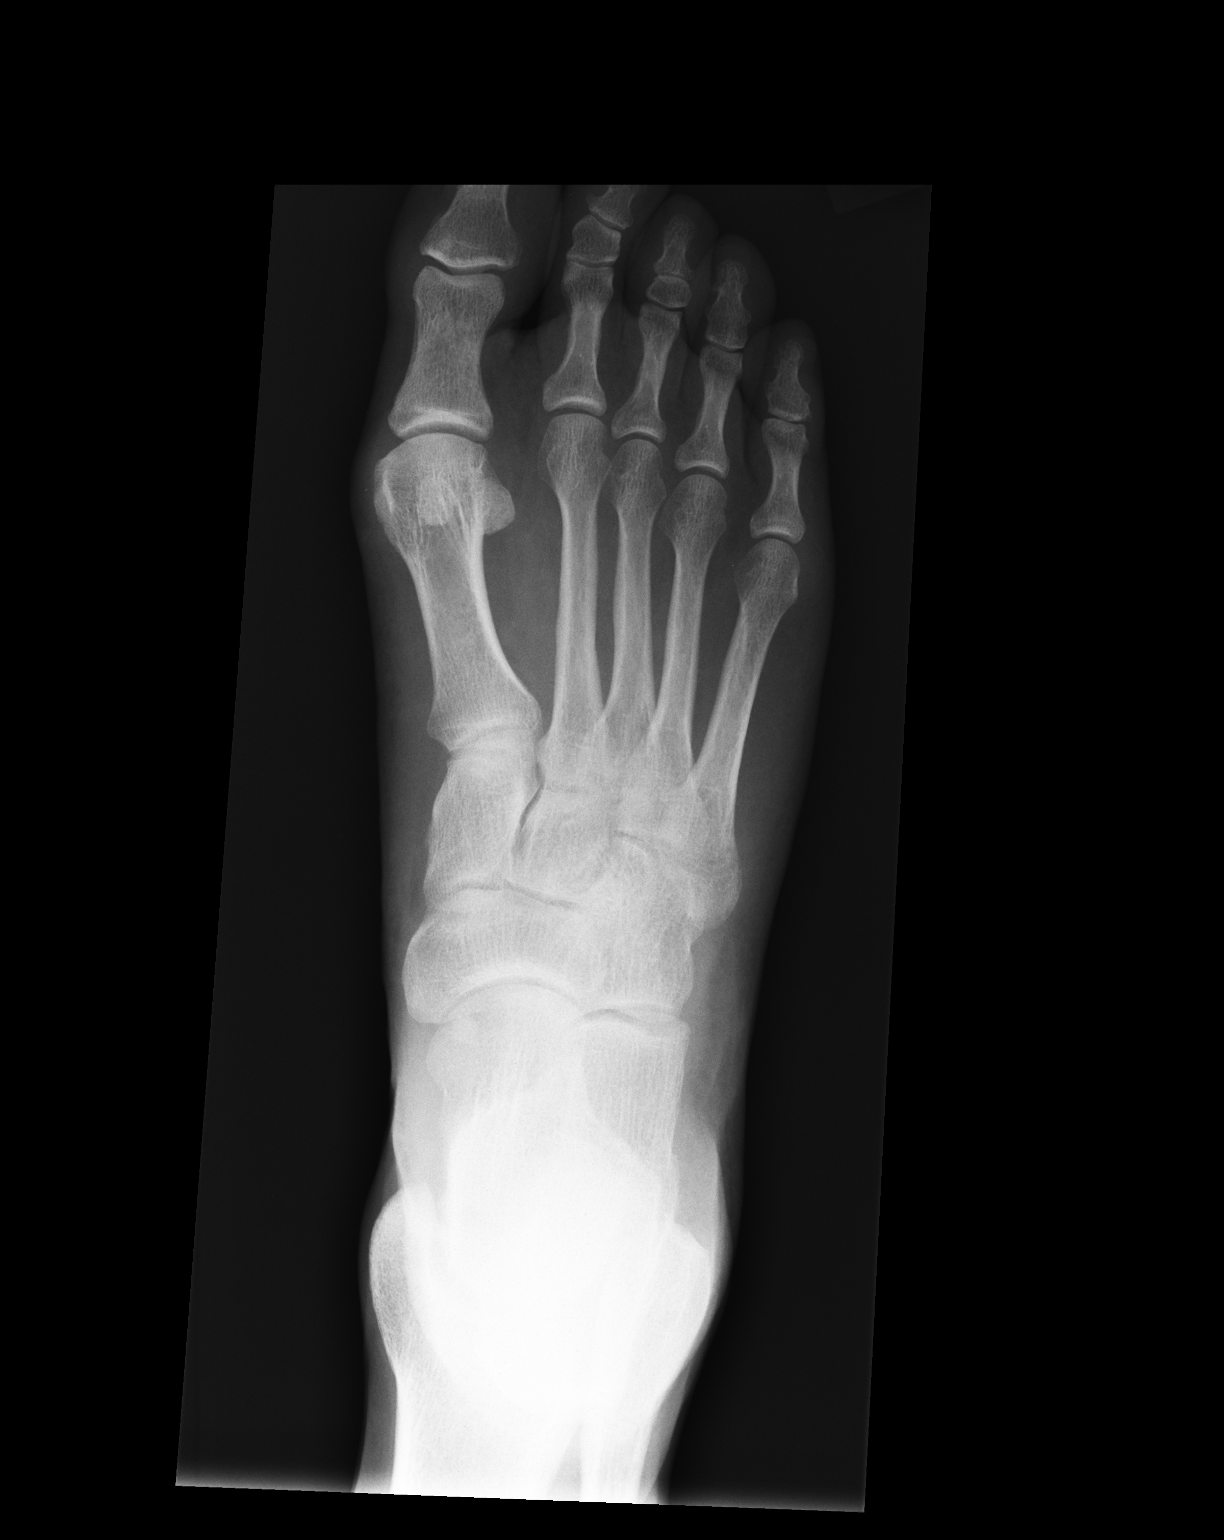

[ap obl int rot]
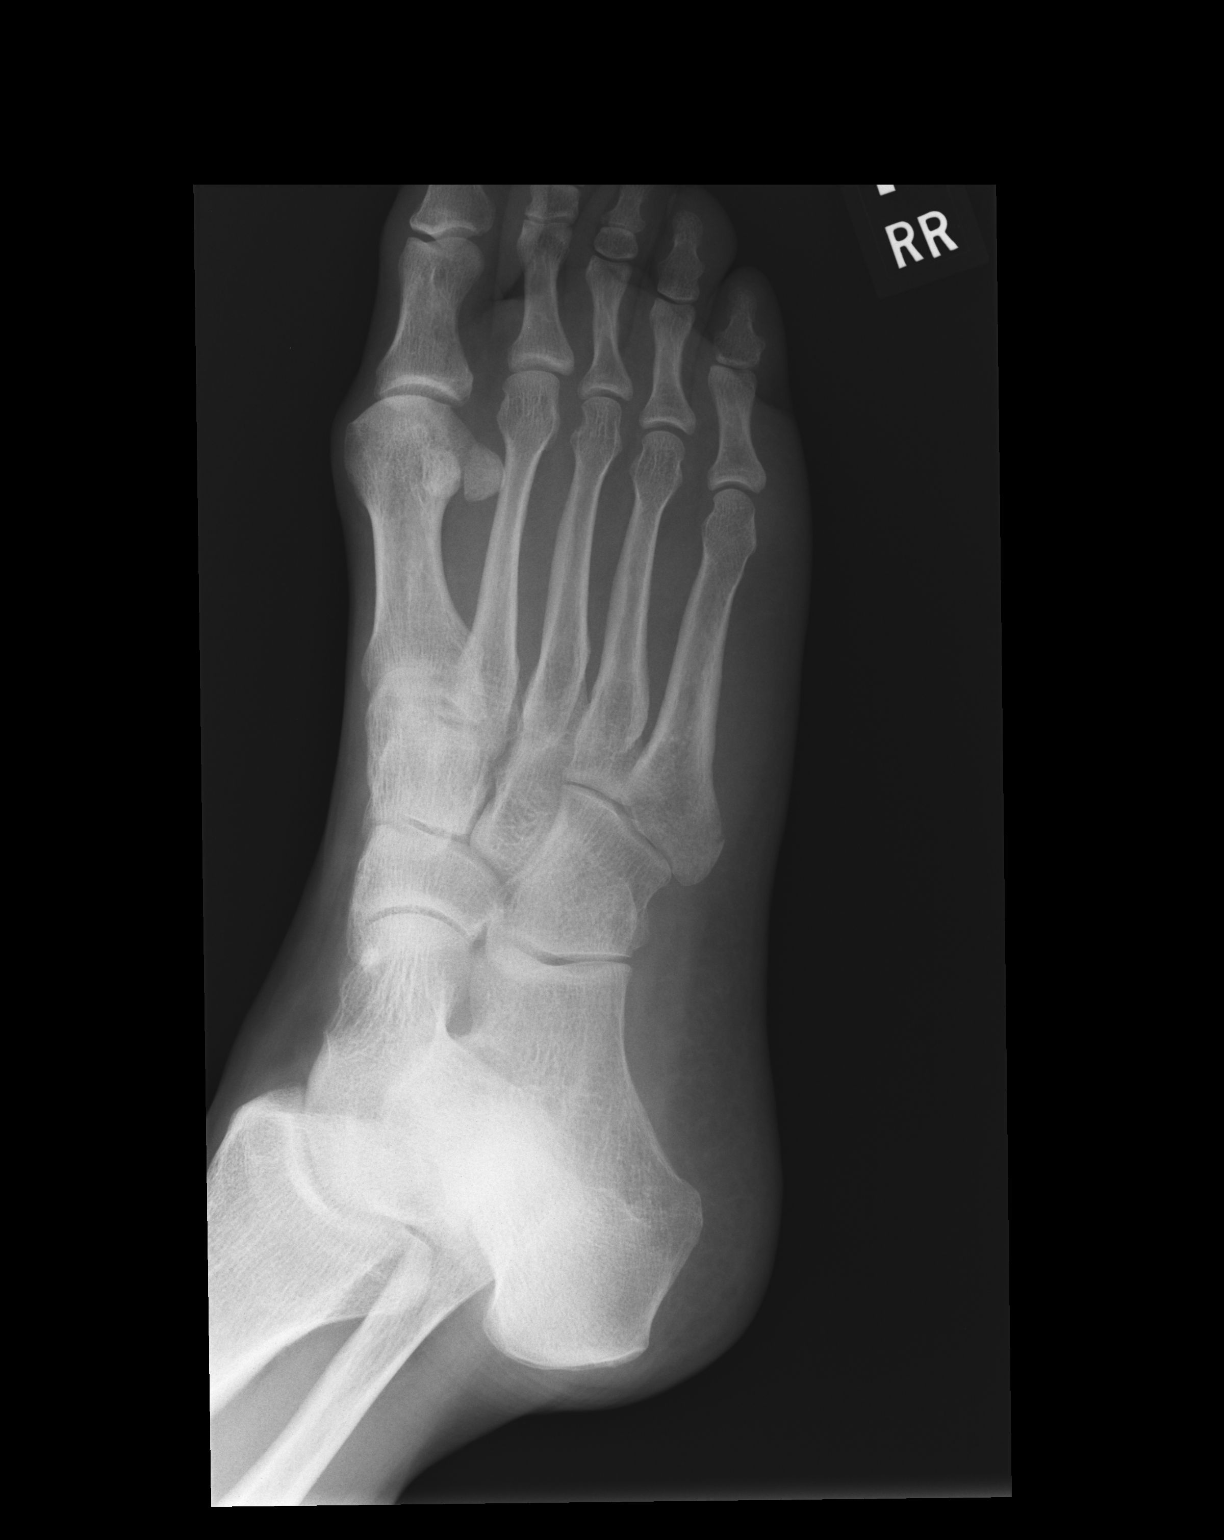

[lateral]
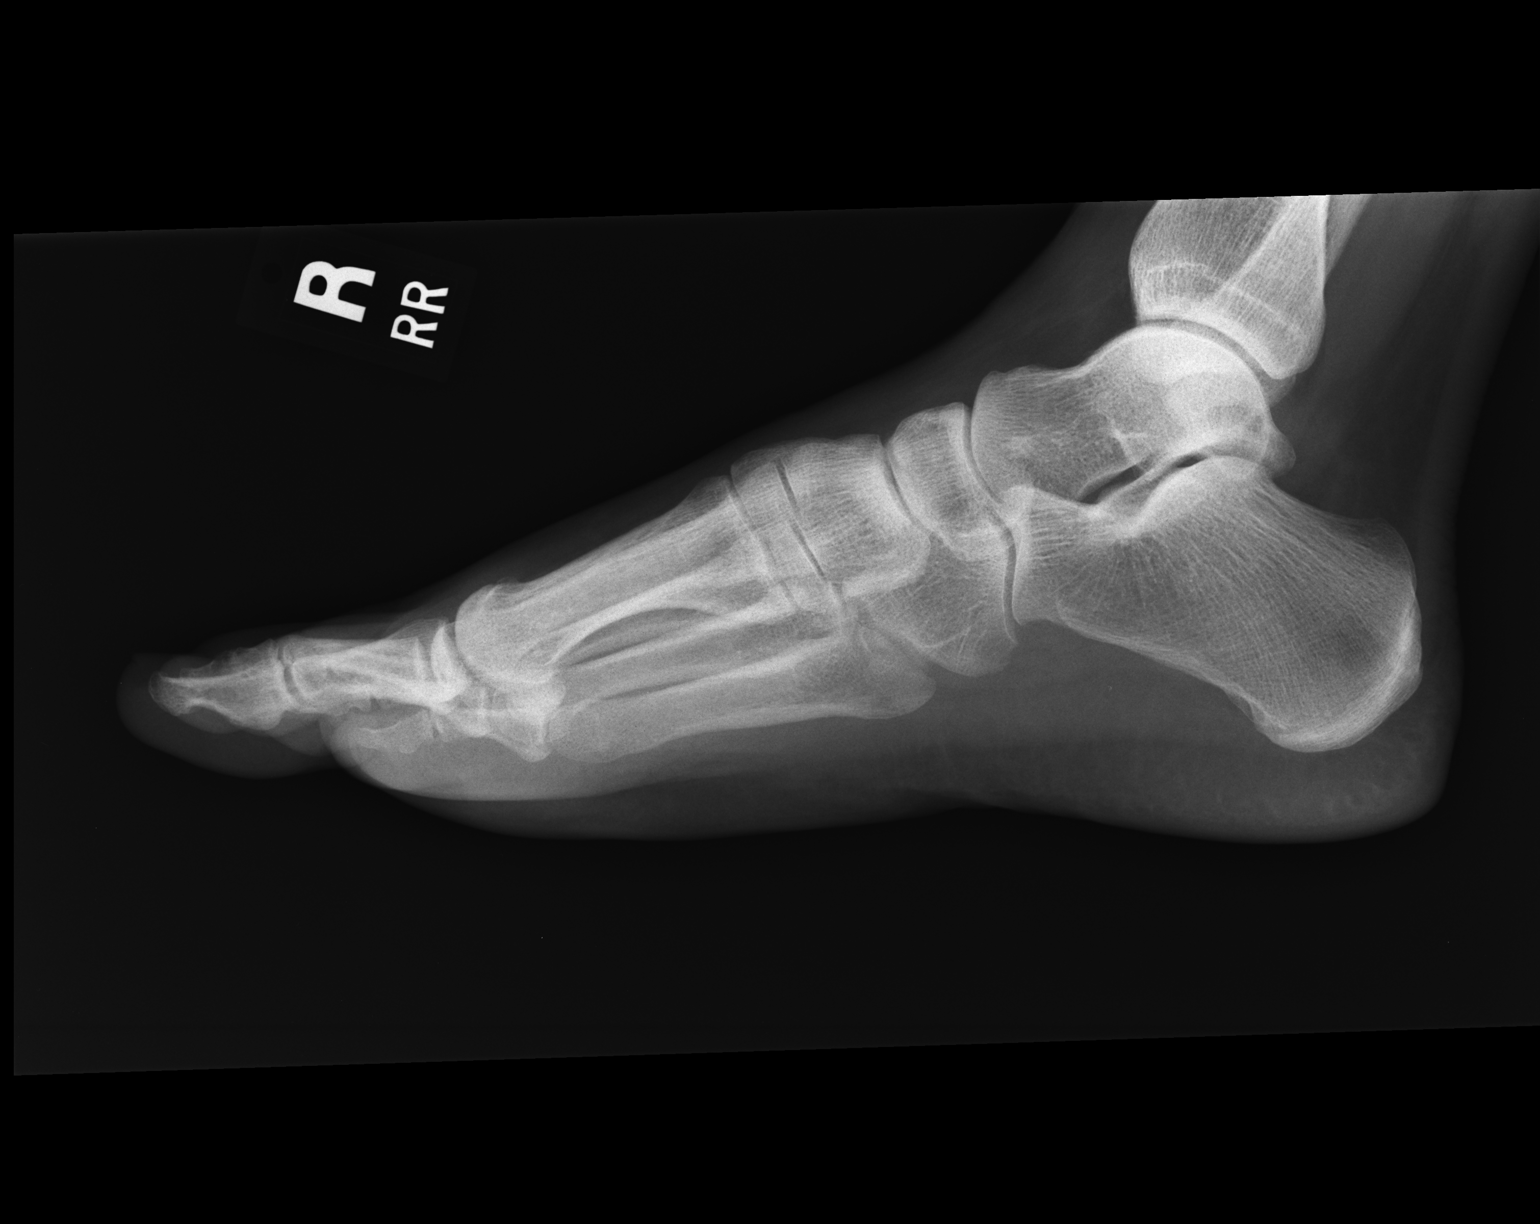

[3 of 3 positions shown; findings below may reference images not displayed]

FINDINGS: Fracture of the base of left fifth metatarsal noted. Mild callus
formation appears to be present. No acute new abnormality.
IMPRESSION: Stable nondisplaced fracture of the base of left fifth metatarsal.
Mild callus formation appears to be present.

## 2018-09-05 ENCOUNTER — Other Ambulatory Visit: Payer: Self-pay

## 2018-09-05 DIAGNOSIS — Z20822 Contact with and (suspected) exposure to covid-19: Secondary | ICD-10-CM

## 2018-09-06 LAB — NOVEL CORONAVIRUS, NAA: SARS-CoV-2, NAA: NOT DETECTED

## 2018-09-09 ENCOUNTER — Other Ambulatory Visit: Payer: Self-pay | Admitting: *Deleted

## 2018-09-09 DIAGNOSIS — Z20822 Contact with and (suspected) exposure to covid-19: Secondary | ICD-10-CM

## 2018-09-10 LAB — NOVEL CORONAVIRUS, NAA: SARS-CoV-2, NAA: NOT DETECTED

## 2018-09-13 ENCOUNTER — Other Ambulatory Visit: Payer: Self-pay

## 2018-09-13 DIAGNOSIS — Z20822 Contact with and (suspected) exposure to covid-19: Secondary | ICD-10-CM

## 2018-09-14 LAB — NOVEL CORONAVIRUS, NAA: SARS-CoV-2, NAA: DETECTED — AB

## 2018-09-21 ENCOUNTER — Other Ambulatory Visit: Payer: Self-pay

## 2018-09-21 DIAGNOSIS — Z20822 Contact with and (suspected) exposure to covid-19: Secondary | ICD-10-CM

## 2018-09-23 ENCOUNTER — Other Ambulatory Visit: Payer: Self-pay

## 2018-09-23 DIAGNOSIS — Z20822 Contact with and (suspected) exposure to covid-19: Secondary | ICD-10-CM

## 2018-09-23 LAB — NOVEL CORONAVIRUS, NAA

## 2018-09-25 LAB — NOVEL CORONAVIRUS, NAA: SARS-CoV-2, NAA: NOT DETECTED

## 2022-02-20 ENCOUNTER — Emergency Department (HOSPITAL_COMMUNITY)
Admission: EM | Admit: 2022-02-20 | Discharge: 2022-02-20 | Disposition: A | Discharge status: Home / Self Care | Payer: BLUE CROSS/BLUE SHIELD | Attending: Emergency Medicine | Admitting: Emergency Medicine

## 2022-02-20 ENCOUNTER — Emergency Department (HOSPITAL_COMMUNITY): Payer: BLUE CROSS/BLUE SHIELD

## 2022-02-20 ENCOUNTER — Encounter (HOSPITAL_COMMUNITY): Payer: Self-pay | Admitting: Emergency Medicine

## 2022-02-20 DIAGNOSIS — S0990XA Unspecified injury of head, initial encounter: Secondary | ICD-10-CM | POA: Diagnosis present

## 2022-02-20 DIAGNOSIS — W010XXA Fall on same level from slipping, tripping and stumbling without subsequent striking against object, initial encounter: Secondary | ICD-10-CM | POA: Diagnosis not present

## 2022-02-20 DIAGNOSIS — S0591XA Unspecified injury of right eye and orbit, initial encounter: Secondary | ICD-10-CM

## 2022-02-20 DIAGNOSIS — S0011XA Contusion of right eyelid and periocular area, initial encounter: Secondary | ICD-10-CM | POA: Insufficient documentation

## 2022-02-20 NOTE — ED Triage Notes (Signed)
Pt here from home with injury to right eye after falling walking the dog no loc , no blurred vision

## 2022-02-20 NOTE — ED Provider Notes (Signed)
[facility] DEPT Provider Note   CSN: [id number] Arrival date & time: 02/20/22  0957     History  Chief Complaint  Patient presents with   Eye Injury    Tonya Cordova is a 52 y.o. female.   Eye Injury     This is a 52 year old female presenting today due to head injury.  Patient was walking down the stairs, she was on flat ground when she tripped over her dog and fell hitting her head.  She has significant swelling and contusion over the right eye.  Denies any diplopia, loss of vision, blurry vision, syncope, nausea, vomiting, neck pain.  Not on blood thinners.  Does not wear contacts or glasses at baseline.  Home Medications Prior to Admission medications   Not on File      Allergies    Patient has no known allergies.    Review of Systems   Review of Systems  Physical Exam Updated Vital Signs BP (!) 142/73 (BP Location: Left Arm)   Pulse (!) 104   Temp 98.2 F (36.8 C) (Oral)   Resp 20   SpO2 99%  Physical Exam Vitals and nursing note reviewed. Exam conducted with a chaperone present.  Constitutional:      Appearance: Normal appearance.  HENT:     Head: Normocephalic.     Comments: Right periorbital ecchymosis Eyes:     General: No scleral icterus.       Right eye: No discharge.        Left eye: No discharge.     Extraocular Movements: Extraocular movements intact.     Pupils: Pupils are equal, round, and reactive to light.     Comments: PERRLA, EOMI without entrapment.  Conjunctivitis intact, no lacerations, no teardrop pupil.  Cardiovascular:     Rate and Rhythm: Normal rate and regular rhythm.     Pulses: Normal pulses.     Heart sounds: Normal heart sounds. No murmur heard.    No friction rub. No gallop.  Pulmonary:     Effort: Pulmonary effort is normal. No respiratory distress.     Breath sounds: Normal breath sounds.  Abdominal:     General: Abdomen is flat. Bowel sounds are normal. There is no distension.      Palpations: Abdomen is soft.     Tenderness: There is no abdominal tenderness.  Musculoskeletal:     Cervical back: Normal range of motion. No rigidity or tenderness.  Skin:    General: Skin is warm and dry.     Coloration: Skin is not jaundiced.  Neurological:     Mental Status: She is alert. Mental status is at baseline.     Coordination: Coordination normal.     Comments: Cranial nerves II through XII are grossly intact right upper and lower extremity strength is symmetric bilaterally.  Ambulatory with steady gait.     ED Results / Procedures / Treatments   Labs (all labs ordered are listed, but only abnormal results are displayed) Labs Reviewed - No data to display  EKG None  Radiology No results found.  Procedures Procedures    Medications Ordered in ED Medications - No data to display  ED Course/ Medical Decision Making/ A&P                             Medical Decision Making Amount and/or Complexity of Data Reviewed Radiology: ordered.   Patient presents due to  head injury.  Differential includes but not limited to facial fracture, orbital wall fracture, entrapment, retro-orbital hematoma.  On exam patient is without any focal deficits.  There is no cervical spine tenderness, amatory steady gait.  EOMI without any entrapment which is reassuring but she does have significant periorbital ecchymosis.  I do not think patient needs a CT head or cervical spine based on [place] head CT rules and Nexus C-spine criteria.  Will get a CT maxillofacial to evaluate for orbital wall trauma.  CT maxillofacial was reviewed by myself, notable for hematoma but no facial fractures.   I reevaluated patient who is resting comfortably.  She is not having any visual acuity changes, given there is no entrapment, traumatic findings on CT and patient is well-appearing I do not think any additional workup is indicated at this time.  I discussed return precautions with the patient who  verbalized understanding.  Stable for discharge at this time.        Final Clinical Impression(s) / ED Diagnoses Final diagnoses:  None    Rx / DC Orders ED Discharge Orders     None         Sherrill Raring, Hershal Coria 02/20/22 1454    Dorie Rank, MD 02/21/22 [phone]

## 2022-02-20 NOTE — ED Notes (Signed)
Patient is alert and oriented.  Skin warm and dry.  He has gross swelling to the right eye with bruising.  No drainage noted.  She verbalized understanding of discharge instructions and reasons to return to the ED

## 2022-02-20 NOTE — Discharge Instructions (Addendum)
You are seen today due to head injury.  CT scan shows no deeper trauma or injury but you do have a large bruise around the eye.  If you develop double vision, loss of vision, pain with eye movements, inability to move your eye you should return back to the ED for evaluation.  Otherwise follow-up with your primary next week for reevaluation.  Expect the bruise to be there for probably 2 weeks, you can ice, take Tylenol and Motrin as needed for pain.     CLINICAL DATA:  Facial trauma, blunt. Injury to right eye after  falling walking the dog. No loss of consciousness or blurred vision.    EXAM:  CT MAXILLOFACIAL WITHOUT CONTRAST    TECHNIQUE:  Multidetector CT imaging of the maxillofacial structures was  performed. Multiplanar CT image reconstructions were also generated.    RADIATION DOSE REDUCTION: This exam was performed according to the  departmental dose-optimization program which includes automated  exposure control, adjustment of the mA and/or kV according to  patient size and/or use of iterative reconstruction technique.    COMPARISON:  None Available.    FINDINGS:  Osseous: No fracture or mandibular dislocation. No destructive  process.    Orbits: Negative. No traumatic or inflammatory finding.    Sinuses: Mild mucosal thickening in the left maxillary sinus.    Soft tissues: 2.4 x 1.5 cm hematoma over lying the right  superolateral orbital rim.    Limited intracranial: Unremarkable.    IMPRESSION:  Large hematoma overlying the right superolateral orbital rim without  underlying facial fracture.      Electronically Signed
